# Patient Record
Sex: Female | Born: 1990 | Race: White | Hispanic: No | Marital: Married | Smoking: Never smoker
Health system: Southern US, Community
[De-identification: ages and names within clinical notes are randomized; demographics above are authoritative.]

## PROBLEM LIST (undated history)

## (undated) DIAGNOSIS — G9332 Myalgic encephalomyelitis/chronic fatigue syndrome: Secondary | ICD-10-CM

## (undated) DIAGNOSIS — F329 Major depressive disorder, single episode, unspecified: Secondary | ICD-10-CM

## (undated) DIAGNOSIS — Z862 Personal history of diseases of the blood and blood-forming organs and certain disorders involving the immune mechanism: Secondary | ICD-10-CM

## (undated) DIAGNOSIS — E042 Nontoxic multinodular goiter: Secondary | ICD-10-CM

## (undated) DIAGNOSIS — G43909 Migraine, unspecified, not intractable, without status migrainosus: Secondary | ICD-10-CM

## (undated) DIAGNOSIS — N809 Endometriosis, unspecified: Secondary | ICD-10-CM

## (undated) DIAGNOSIS — F411 Generalized anxiety disorder: Secondary | ICD-10-CM

## (undated) DIAGNOSIS — F4310 Post-traumatic stress disorder, unspecified: Secondary | ICD-10-CM

## (undated) DIAGNOSIS — R5382 Chronic fatigue, unspecified: Secondary | ICD-10-CM

## (undated) DIAGNOSIS — F419 Anxiety disorder, unspecified: Secondary | ICD-10-CM

## (undated) DIAGNOSIS — G8929 Other chronic pain: Secondary | ICD-10-CM

## (undated) DIAGNOSIS — M797 Fibromyalgia: Secondary | ICD-10-CM

## (undated) DIAGNOSIS — R102 Pelvic and perineal pain: Secondary | ICD-10-CM

## (undated) HISTORY — PX: WISDOM TOOTH EXTRACTION: SHX21

## (undated) HISTORY — PX: NO PAST SURGERIES: SHX2092

---

## 2018-02-08 ENCOUNTER — Emergency Department (HOSPITAL_COMMUNITY): Payer: BLUE CROSS/BLUE SHIELD

## 2018-02-08 ENCOUNTER — Other Ambulatory Visit: Payer: Self-pay

## 2018-02-08 ENCOUNTER — Emergency Department (HOSPITAL_COMMUNITY)
Admission: EM | Admit: 2018-02-08 | Discharge: 2018-02-08 | Disposition: A | Discharge status: Home / Self Care | Payer: BLUE CROSS/BLUE SHIELD | Attending: Emergency Medicine | Admitting: Emergency Medicine

## 2018-02-08 ENCOUNTER — Encounter (HOSPITAL_COMMUNITY): Payer: Self-pay | Admitting: Emergency Medicine

## 2018-02-08 DIAGNOSIS — R1031 Right lower quadrant pain: Secondary | ICD-10-CM | POA: Insufficient documentation

## 2018-02-08 HISTORY — DX: Anxiety disorder, unspecified: F41.9

## 2018-02-08 HISTORY — DX: Major depressive disorder, single episode, unspecified: F32.9

## 2018-02-08 HISTORY — DX: Chronic fatigue, unspecified: R53.82

## 2018-02-08 HISTORY — DX: Other chronic pain: G89.29

## 2018-02-08 LAB — URINALYSIS, ROUTINE W REFLEX MICROSCOPIC
Bilirubin Urine: NEGATIVE
Glucose, UA: NEGATIVE mg/dL
KETONES UR: 5 mg/dL — AB
Leukocytes, UA: NEGATIVE
Nitrite: NEGATIVE
PH: 6 (ref 5.0–8.0)
Protein, ur: NEGATIVE mg/dL
SPECIFIC GRAVITY, URINE: 1.012 (ref 1.005–1.030)

## 2018-02-08 LAB — COMPREHENSIVE METABOLIC PANEL
ALT: 10 U/L (ref 0–44)
AST: 14 U/L — AB (ref 15–41)
Albumin: 4.3 g/dL (ref 3.5–5.0)
Alkaline Phosphatase: 46 U/L (ref 38–126)
Anion gap: 9 (ref 5–15)
BILIRUBIN TOTAL: 1.1 mg/dL (ref 0.3–1.2)
CALCIUM: 9.5 mg/dL (ref 8.9–10.3)
CHLORIDE: 104 mmol/L (ref 98–111)
CO2: 26 mmol/L (ref 22–32)
CREATININE: 0.81 mg/dL (ref 0.44–1.00)
Glucose, Bld: 92 mg/dL (ref 70–99)
POTASSIUM: 4 mmol/L (ref 3.5–5.1)
Sodium: 139 mmol/L (ref 135–145)
Total Protein: 7.2 g/dL (ref 6.5–8.1)

## 2018-02-08 LAB — CBC
HCT: 42.2 % (ref 36.0–46.0)
Hemoglobin: 13.9 g/dL (ref 12.0–15.0)
MCH: 29.5 pg (ref 26.0–34.0)
MCHC: 32.9 g/dL (ref 30.0–36.0)
MCV: 89.6 fL (ref 78.0–100.0)
PLATELETS: 264 10*3/uL (ref 150–400)
RBC: 4.71 MIL/uL (ref 3.87–5.11)
RDW: 11.9 % (ref 11.5–15.5)
WBC: 6.2 10*3/uL (ref 4.0–10.5)

## 2018-02-08 LAB — I-STAT BETA HCG BLOOD, ED (MC, WL, AP ONLY): I-stat hCG, quantitative: <5 m[IU]/mL (ref <5)

## 2018-02-08 LAB — LIPASE, BLOOD: LIPASE: 30 U/L (ref 11–51)

## 2018-02-08 MED ORDER — IOPAMIDOL (ISOVUE-300) INJECTION 61%
100.0000 mL | Freq: Once | INTRAVENOUS | Status: AC | PRN
  Administered 2018-02-08: 100 mL via INTRAVENOUS

## 2018-02-08 MED ORDER — IOPAMIDOL (ISOVUE-300) INJECTION 61%
INTRAVENOUS | Status: AC
  Filled 2018-02-08: qty 100

## 2018-02-08 NOTE — ED Provider Notes (Signed)
Patient placed in Quick Look pathway, seen and evaluated   Chief Complaint: abd pain  HPI:   Pt presents to the ED today c/o RLQ abd pain the began 2 days ago. Pain waxes and wanes. Pain worse is she tenses her abd muscles. She reports nausea, but denies diarrhea, constipation, urinary sxs. Denies fevers or chills. Denies vaginal discharge, bleeding. Reports recently has had irregular menses. Denies concern for STD. LMP was last week.  ROS: abd pain (one)  Physical Exam:   Gen: No distress  Neuro: Awake and Alert  Skin: Warm    Focused Exam: +BS.  TTP to RLQ. Rebound tenderness present. No guarding or rigidity.    Initiation of care has begun. The patient has been counseled on the process, plan, and necessity for staying for the completion/evaluation, and the remainder of the medical screening examination  Pt advised to inform nursing staff immediately if they experience any new or worsening of symptoms while waiting in the waiting room.    Rayne Du 02/08/18 1741    Gerhard Munch, MD 02/08/18 [phone]

## 2018-02-08 NOTE — ED Notes (Signed)
Pt back from CT

## 2018-02-08 NOTE — ED Triage Notes (Signed)
Patient to ED c/o RLQ pain x 2 days with intermittent nausea and low-grade fevers. Denies vomiting, diarrhea, urinary symptoms. RLQ tender to palpation. LMP 7 days ago.

## 2018-02-08 NOTE — Discharge Instructions (Addendum)
Please read attached information. If you experience any new or worsening signs or symptoms please return to the emergency room for evaluation. Please follow-up with your primary care provider or specialist as discussed.  °

## 2018-02-08 NOTE — ED Provider Notes (Signed)
[facility] EMERGENCY DEPARTMENT Provider Note   CSN: [id number] Arrival date & time: 02/08/18  1605     History   Chief Complaint Chief Complaint  Patient presents with  . Abdominal Pain    HPI Alicia Olsen is a 27 y.o. female.  HPI    26 year old female presents today with complaints of right lower quadrant abdominal pain. Patient notes symptoms started 2 days ago with a waxing and waning ache in her right lower abdomen. She notes this was slow onset with progressive worsening. She reports some nausea but denies any vomiting, diarrhea, constipation or urinary symptoms. Patient and she did not have a bowel movement today. She denies any fever, vaginal discharge, or bleeding.    Past Medical History:  Diagnosis Date  . Anxiety   . Chronic fatigue   . Chronic pain   . Depression     There are no active problems to display for this patient.   Past Surgical History:  Procedure Laterality Date  . WISDOM TOOTH EXTRACTION       OB History   None      Home Medications    Prior to Admission medications   Not on File    Family History No family history on file.  Social History Social History   Tobacco Use  . Smoking status: Not on file  Substance Use Topics  . Alcohol use: Not on file  . Drug use: Not on file     Allergies   Patient has no known allergies.   Review of Systems Review of Systems  All other systems reviewed and are negative.    Physical Exam Updated Vital Signs BP 116/86 (BP Location: Right Arm)   Pulse (!) 58   Temp 99.3 F (37.4 C) (Oral)   Resp 18   LMP 02/01/2018 (Exact Date)   SpO2 100%   Physical Exam  Constitutional: She is oriented to person, place, and time. She appears well-developed and well-nourished.  HENT:  Head: Normocephalic and atraumatic.  Eyes: Pupils are equal, round, and reactive to light. Conjunctivae are normal. Right eye exhibits no discharge. Left eye exhibits no discharge. No  scleral icterus.  Neck: Normal range of motion. No JVD present. No tracheal deviation present.  Pulmonary/Chest: Effort normal. No stridor.  Abdominal: Soft. She exhibits no distension and no mass. There is tenderness. There is no rebound and no guarding. No hernia.  Tenderness to palpation of right lower quadrant no rebound or guarding, remainder of abdomen soft nontender  Neurological: She is alert and oriented to person, place, and time. Coordination normal.  Psychiatric: She has a normal mood and affect. Her behavior is normal. Judgment and thought content normal.  Nursing note and vitals reviewed.    ED Treatments / Results  Labs (all labs ordered are listed, but only abnormal results are displayed) Labs Reviewed  COMPREHENSIVE METABOLIC PANEL - Abnormal; Notable for the following components:      Result Value   BUN <5 (*)    AST 14 (*)    All other components within normal limits  URINALYSIS, ROUTINE W REFLEX MICROSCOPIC - Abnormal; Notable for the following components:   Hgb urine dipstick SMALL (*)    Ketones, ur 5 (*)    Bacteria, UA FEW (*)    All other components within normal limits  LIPASE, BLOOD  CBC  I-STAT BETA HCG BLOOD, ED ([facility], [facility], [facility] ONLY)    EKG None  Radiology Ct Abdomen Pelvis W Contrast  Result Date: 02/08/2018 CLINICAL DATA:  Right lower quadrant pain with nausea EXAM: CT ABDOMEN AND PELVIS WITH CONTRAST TECHNIQUE: Multidetector CT imaging of the abdomen and pelvis was performed using the standard protocol following bolus administration of intravenous contrast. CONTRAST:  ISOVUE-300 IOPAMIDOL (ISOVUE-300) INJECTION 61% COMPARISON:  None. FINDINGS: Lower chest: No acute abnormality. Hepatobiliary: No focal liver abnormality is seen. No gallstones, gallbladder wall thickening, or biliary dilatation. Pancreas: Unremarkable. No pancreatic ductal dilatation or surrounding inflammatory changes. Spleen: Normal in size without focal abnormality.  Adrenals/Urinary Tract: Adrenal glands are unremarkable. Kidneys are normal, without renal calculi, focal lesion, or hydronephrosis. Bladder is unremarkable. Stomach/Bowel: Stomach is within normal limits. Appendix appears normal. No evidence of bowel wall thickening, distention, or inflammatory changes. Vascular/Lymphatic: No significant vascular findings are present. No enlarged abdominal or pelvic lymph nodes. Reproductive: Uterus and bilateral adnexa are unremarkable. Small right ovarian cyst Other: Negative for free air or free fluid. Musculoskeletal: No acute or significant osseous findings. IMPRESSION: Negative for acute appendicitis. No CT evidence for acute intra-abdominal or pelvic abnormality Electronically Signed   By: Jasmine Pang M.D.   On: 02/08/2018 21:33    Procedures Procedures (including critical care time)  Medications Ordered in ED Medications  iopamidol (ISOVUE-300) 61 % injection (has no administration in time range)  iopamidol (ISOVUE-300) 61 % injection 100 mL (100 mLs Intravenous Contrast Given 02/08/18 2053)     Initial Impression / Assessment and Plan / ED Course  I have reviewed the triage vital signs and the nursing notes.  Pertinent labs & imaging results that were available during my care of the patient were reviewed by me and considered in my medical decision making (see chart for details).     Labs: urinalysis, CBC, CMP, lipase, I stat beta HCG  Imaging: CT abdomen pelvis with contrast  Consults:  Therapeutics:  Discharge Meds:   Assessment/Plan:  27 YO female presents today with complaints of abdominal pain. Patient has reassuring workup with no signs of infectious etiology, no acute findings on CT scan. She has no vaginal complaints, low suspicion for GYN pathology including torsion. Patient will instructed use ibuprofen or Tylenol as needed for discomfort, return immediately if she doubts any new or worsening signs or symptoms and follow up as an  outpatient with her primary care if symptoms persist. Patient verbalized understanding and agreement to today's plan had no further questions or concerns.    Final Clinical Impressions(s) / ED Diagnoses   Final diagnoses:  Right lower quadrant abdominal pain    ED Discharge Orders    None       Rosalio Loud 02/08/18 2144    Arby Barrette, MD 02/24/18 1035

## 2018-02-08 NOTE — ED Notes (Signed)
Patient able to ambulate independently  

## 2018-07-08 ENCOUNTER — Other Ambulatory Visit: Payer: Self-pay | Admitting: Family Medicine

## 2018-07-08 DIAGNOSIS — E041 Nontoxic single thyroid nodule: Secondary | ICD-10-CM

## 2018-07-13 ENCOUNTER — Other Ambulatory Visit: Payer: BLUE CROSS/BLUE SHIELD

## 2018-07-14 ENCOUNTER — Other Ambulatory Visit: Payer: BLUE CROSS/BLUE SHIELD

## 2018-07-21 ENCOUNTER — Ambulatory Visit
Admission: RE | Admit: 2018-07-21 | Discharge: 2018-07-21 | Disposition: A | Discharge status: Home / Self Care | Payer: BLUE CROSS/BLUE SHIELD | Source: Ambulatory Visit | Attending: Family Medicine | Admitting: Family Medicine

## 2018-07-21 DIAGNOSIS — E041 Nontoxic single thyroid nodule: Secondary | ICD-10-CM

## 2019-01-12 ENCOUNTER — Other Ambulatory Visit: Payer: Self-pay | Admitting: Nurse Practitioner

## 2019-01-12 DIAGNOSIS — R102 Pelvic and perineal pain: Secondary | ICD-10-CM

## 2019-01-12 DIAGNOSIS — R109 Unspecified abdominal pain: Secondary | ICD-10-CM

## 2019-01-24 ENCOUNTER — Ambulatory Visit
Admission: RE | Admit: 2019-01-24 | Discharge: 2019-01-24 | Disposition: A | Discharge status: Home / Self Care | Payer: BC Managed Care – PPO | Source: Ambulatory Visit | Attending: Nurse Practitioner | Admitting: Nurse Practitioner

## 2019-01-24 ENCOUNTER — Ambulatory Visit
Admission: RE | Admit: 2019-01-24 | Discharge: 2019-01-24 | Disposition: A | Discharge status: Home / Self Care | Payer: Self-pay | Source: Ambulatory Visit | Attending: Nurse Practitioner | Admitting: Nurse Practitioner

## 2019-01-24 DIAGNOSIS — R109 Unspecified abdominal pain: Secondary | ICD-10-CM

## 2019-01-24 DIAGNOSIS — R102 Pelvic and perineal pain: Secondary | ICD-10-CM

## 2019-01-30 ENCOUNTER — Other Ambulatory Visit: Payer: Self-pay | Admitting: Physician Assistant

## 2019-01-30 DIAGNOSIS — R109 Unspecified abdominal pain: Secondary | ICD-10-CM

## 2019-02-01 ENCOUNTER — Ambulatory Visit
Admission: RE | Admit: 2019-02-01 | Discharge: 2019-02-01 | Disposition: A | Discharge status: Home / Self Care | Payer: BC Managed Care – PPO | Source: Ambulatory Visit | Attending: Physician Assistant | Admitting: Physician Assistant

## 2019-02-01 ENCOUNTER — Other Ambulatory Visit: Payer: Self-pay

## 2019-02-01 DIAGNOSIS — R109 Unspecified abdominal pain: Secondary | ICD-10-CM

## 2019-04-11 ENCOUNTER — Other Ambulatory Visit: Payer: Self-pay

## 2019-04-11 DIAGNOSIS — Z20822 Contact with and (suspected) exposure to covid-19: Secondary | ICD-10-CM

## 2019-04-12 LAB — NOVEL CORONAVIRUS, NAA: SARS-CoV-2, NAA: NOT DETECTED

## 2019-07-20 ENCOUNTER — Other Ambulatory Visit: Payer: Self-pay

## 2019-07-20 ENCOUNTER — Encounter (HOSPITAL_BASED_OUTPATIENT_CLINIC_OR_DEPARTMENT_OTHER): Payer: Self-pay | Admitting: Obstetrics & Gynecology

## 2019-07-20 NOTE — Progress Notes (Signed)
Spoke w/ via phone for pre-op interview--- PT Lab needs dos----   Urine preg, T&S            Lab results------ no COVID test ------ 07-25-2019 @ 1405 Arrive at ------- 0530 NPO after ------ MN Medications to take morning of surgery ----- NONE Diabetic medication ----- n/a Patient Special Instructions ----- n/a Pre-Op special Istructions ----- n/a Patient verbalized understanding of instructions that were given at this phone interview. Patient denies shortness of breath, chest pain, fever, cough a this phone interview.

## 2019-07-22 ENCOUNTER — Other Ambulatory Visit: Payer: Self-pay

## 2019-07-22 ENCOUNTER — Emergency Department (HOSPITAL_COMMUNITY): Payer: BC Managed Care – PPO

## 2019-07-22 ENCOUNTER — Emergency Department (HOSPITAL_COMMUNITY)
Admission: EM | Admit: 2019-07-22 | Discharge: 2019-07-23 | Disposition: A | Discharge status: Home / Self Care | Payer: BC Managed Care – PPO | Attending: Emergency Medicine | Admitting: Emergency Medicine

## 2019-07-22 ENCOUNTER — Encounter (HOSPITAL_COMMUNITY): Payer: Self-pay | Admitting: *Deleted

## 2019-07-22 DIAGNOSIS — Z79899 Other long term (current) drug therapy: Secondary | ICD-10-CM | POA: Insufficient documentation

## 2019-07-22 DIAGNOSIS — R102 Pelvic and perineal pain: Secondary | ICD-10-CM | POA: Diagnosis present

## 2019-07-22 DIAGNOSIS — B9689 Other specified bacterial agents as the cause of diseases classified elsewhere: Secondary | ICD-10-CM | POA: Diagnosis not present

## 2019-07-22 DIAGNOSIS — N760 Acute vaginitis: Secondary | ICD-10-CM | POA: Insufficient documentation

## 2019-07-22 DIAGNOSIS — R5383 Other fatigue: Secondary | ICD-10-CM

## 2019-07-22 LAB — COMPREHENSIVE METABOLIC PANEL
ALT: 13 U/L (ref 0–44)
AST: 15 U/L (ref 15–41)
Albumin: 4.3 g/dL (ref 3.5–5.0)
Alkaline Phosphatase: 53 U/L (ref 38–126)
Anion gap: 12 (ref 5–15)
BUN: 8 mg/dL (ref 6–20)
CO2: 24 mmol/L (ref 22–32)
Calcium: 9.3 mg/dL (ref 8.9–10.3)
Chloride: 105 mmol/L (ref 98–111)
Creatinine, Ser: 0.73 mg/dL (ref 0.44–1.00)
GFR calc Af Amer: >60 mL/min (ref >60)
GFR calc non Af Amer: >60 mL/min (ref >60)
Glucose, Bld: 103 mg/dL — ABNORMAL HIGH (ref 70–99)
Potassium: 3.5 mmol/L (ref 3.5–5.1)
Sodium: 141 mmol/L (ref 135–145)
Total Bilirubin: 0.4 mg/dL (ref 0.3–1.2)
Total Protein: 7.6 g/dL (ref 6.5–8.1)

## 2019-07-22 LAB — I-STAT BETA HCG BLOOD, ED (MC, WL, AP ONLY): I-stat hCG, quantitative: <5 m[IU]/mL (ref <5)

## 2019-07-22 LAB — URINALYSIS, ROUTINE W REFLEX MICROSCOPIC
Bilirubin Urine: NEGATIVE
Glucose, UA: NEGATIVE mg/dL
Hgb urine dipstick: NEGATIVE
Ketones, ur: NEGATIVE mg/dL
Leukocytes,Ua: NEGATIVE
Nitrite: NEGATIVE
Protein, ur: NEGATIVE mg/dL
Specific Gravity, Urine: 1.012 (ref 1.005–1.030)
pH: 8 (ref 5.0–8.0)

## 2019-07-22 LAB — CBC
HCT: 41.7 % (ref 36.0–46.0)
Hemoglobin: 14.1 g/dL (ref 12.0–15.0)
MCH: 30 pg (ref 26.0–34.0)
MCHC: 33.8 g/dL (ref 30.0–36.0)
MCV: 88.7 fL (ref 80.0–100.0)
Platelets: 329 10*3/uL (ref 150–400)
RBC: 4.7 MIL/uL (ref 3.87–5.11)
RDW: 12.1 % (ref 11.5–15.5)
WBC: 10 10*3/uL (ref 4.0–10.5)
nRBC: 0 % (ref 0.0–0.2)

## 2019-07-22 LAB — LIPASE, BLOOD: Lipase: 32 U/L (ref 11–51)

## 2019-07-22 LAB — WET PREP, GENITAL
Sperm: NONE SEEN
Trich, Wet Prep: NONE SEEN
Yeast Wet Prep HPF POC: NONE SEEN

## 2019-07-22 LAB — PREGNANCY, URINE: Preg Test, Ur: NEGATIVE

## 2019-07-22 LAB — CK: Total CK: 92 U/L (ref 38–234)

## 2019-07-22 MED ORDER — IOHEXOL 300 MG/ML  SOLN
100.0000 mL | Freq: Once | INTRAMUSCULAR | Status: AC | PRN
  Administered 2019-07-22: 100 mL via INTRAVENOUS

## 2019-07-22 MED ORDER — METRONIDAZOLE 500 MG PO TABS: 500.0000 mg | ORAL_TABLET | Freq: Two times a day (BID) | ORAL | 0 refills | Status: AC

## 2019-07-22 MED ORDER — SODIUM CHLORIDE 0.9% FLUSH: 3.0000 mL | Freq: Once | INTRAVENOUS | Status: DC

## 2019-07-22 MED ORDER — SODIUM CHLORIDE 0.9 % IV BOLUS
1000.0000 mL | Freq: Once | INTRAVENOUS | Status: AC
  Administered 2019-07-22: 22:00:00 1000 mL via INTRAVENOUS

## 2019-07-22 NOTE — Discharge Instructions (Addendum)
Your testing is reassuring.  Your ultrasound of your ovaries appears to be normal.  And your appendix appears normal on CT scan.  You should follow-up with your gynecologist for your expiratory surgery next week.  Take the medication for bacterial vaginosis as prescribed.  Do not drink alcohol when taking this medication.  Return to the ED if you have worsening pain, fever, vomiting, or other concerns

## 2019-07-22 NOTE — ED Provider Notes (Signed)
[facility] EMERGENCY DEPARTMENT Provider Note   CSN: [id number] Arrival date & time: 07/22/19  2008     History Chief Complaint  Patient presents with  . Abdominal Pain    Alicia Olsen is a 29 y.o. female.  Patient with history of chronic fatigue syndrome, fibromyalgia and possible endometriosis here with fatigue all over and concerned that she was dehydrated.  States she has extreme fatigue all over but happened after she was grooming her dog today.  It is not uncommon for her but she believes it is worse than usual and she is concerned that she has rhabdomyolysis.  She also has lower abdominal pain which onset about the same time across her lower abdomen that is similar to previous episodes.  She is being scheduled for a laparoscopy by her gynecologist next week to evaluate for possible endometriosis.  She reports this pain is similar to what she is experienced in the past.  She has spotting vaginal bleeding due to her IUD.  She denies any pain with urination or blood in the urine.  No vomiting or diarrhea or fever.  No previous abdominal surgeries. States she is not as concerned about the pain as she is about her fatigue all over.  The history is provided by the patient.  Abdominal Pain Associated symptoms: fatigue   Associated symptoms: no chest pain, no cough, no dysuria, no fever, no hematuria, no nausea, no shortness of breath and no vomiting        Past Medical History:  Diagnosis Date  . Chronic fatigue syndrome    followed by dr Reola Calkins   . Chronic pain   . Endometriosis   . Fibromyalgia    followed by- dr Reola Calkins  . GAD (generalized anxiety disorder)   . History of anemia   . MDD (major depressive disorder)   . Migraines   . Multinodular thyroid    last thyroid ultrasound done by dr Reola Calkins in epic 07-21-2018, stable and did not meet critiria for bx  . Pelvic pain   . PTSD (post-traumatic stress disorder)     There are no problems to display for this  patient.   Past Surgical History:  Procedure Laterality Date  . NO PAST SURGERIES    . WISDOM TOOTH EXTRACTION       OB History   No obstetric history on file.     No family history on file.  Social History   Tobacco Use  . Smoking status: Never Smoker  . Smokeless tobacco: Never Used  Substance Use Topics  . Alcohol use: Yes    Comment: one dialy in evening - wine or a drink  . Drug use: Never    Home Medications Prior to Admission medications   Medication Sig Start Date End Date Taking? Authorizing Provider  busPIRone (BUSPAR) 15 MG tablet Take 15 mg by mouth 3 (three) times daily.    [provider]  cyclobenzaprine (FEXMID) 7.5 MG tablet Take 7.5 mg by mouth 3 (three) times daily as needed for muscle spasms.    [provider]  escitalopram (LEXAPRO) 10 MG tablet Take 10 mg by mouth at bedtime.     [provider]  levonorgestrel (MIRENA) 20 MCG/24HR IUD 1 each by Intrauterine route once.    [provider]  Multiple Vitamins-Minerals (CENTRUM ADULTS) TABS Take 1 tablet by mouth daily.    [provider]  pregabalin (LYRICA) 50 MG capsule Take 100 mg by mouth at bedtime.  [provider]    Allergies    Patient has no known allergies.  Review of Systems   Review of Systems  Constitutional: Positive for activity change, appetite change and fatigue. Negative for fever.  HENT: Negative for congestion and rhinorrhea.   Respiratory: Negative for cough, chest tightness and shortness of breath.   Cardiovascular: Negative for chest pain.  Gastrointestinal: Positive for abdominal pain. Negative for nausea and vomiting.  Genitourinary: Negative for dysuria and hematuria.  Musculoskeletal: Positive for arthralgias and myalgias.  Skin: Negative for rash.  Neurological: Positive for weakness. Negative for dizziness and headaches.    all other systems are negative except as noted in the HPI and PMH.   Physical  Exam Updated Vital Signs BP (!) 125/92 (BP Location: Right Arm)   Pulse 96   Temp 98.3 F (36.8 C) (Oral)   Resp 14   Ht 5\' 10"  (1.778 m)   Wt 77.1 kg   SpO2 100%   BMI 24.39 kg/m   Physical Exam Vitals and nursing note reviewed.  Constitutional:      General: She is not in acute distress.    Appearance: She is well-developed.  HENT:     Head: Normocephalic and atraumatic.     Mouth/Throat:     Pharynx: No oropharyngeal exudate.  Eyes:     Conjunctiva/sclera: Conjunctivae normal.     Pupils: Pupils are equal, round, and reactive to light.  Neck:     Comments: No meningismus. Cardiovascular:     Rate and Rhythm: Normal rate and regular rhythm.     Heart sounds: Normal heart sounds. No murmur.  Pulmonary:     Effort: Pulmonary effort is normal. No respiratory distress.     Breath sounds: Normal breath sounds.  Abdominal:     Palpations: Abdomen is soft.     Tenderness: There is abdominal tenderness. There is no guarding or rebound.     Comments: Suprapubic and right lower quadrant pain, no guarding or rebound  Genitourinary:    Comments: Chaperone present.  Normal sinus genitalia.  White discharge in vaginal vault.  IUD string present.  No CMT.  No levels adnexal pain but diffusely tender Musculoskeletal:        General: No tenderness. Normal range of motion.     Cervical back: Normal range of motion and neck supple.     Comments: No CVA tenderness  Skin:    General: Skin is warm.  Neurological:     Mental Status: She is alert and oriented to person, place, and time.     Cranial Nerves: No cranial nerve deficit.     Motor: No abnormal muscle tone.     Coordination: Coordination normal.     Comments: No ataxia on finger to nose bilaterally. No pronator drift. Generalized weakness with poor effor and 4/5 strength throughout. CN 2-12 intact.Equal grip strength. Sensation intact.   Psychiatric:        Behavior: Behavior normal.     ED Results / Procedures /  Treatments   Labs (all labs ordered are listed, but only abnormal results are displayed) Labs Reviewed  WET PREP, GENITAL - Abnormal; Notable for the following components:      Result Value   Clue Cells Wet Prep HPF POC PRESENT (*)    WBC, Wet Prep HPF POC MANY (*)    All other components within normal limits  COMPREHENSIVE METABOLIC PANEL - Abnormal; Notable for the following components:   Glucose, Bld 103 (*)  All other components within normal limits  URINALYSIS, ROUTINE W REFLEX MICROSCOPIC - Abnormal; Notable for the following components:   APPearance HAZY (*)    All other components within normal limits  LIPASE, BLOOD  CBC  CK  PREGNANCY, URINE  I-STAT BETA HCG BLOOD, ED ([facility], [facility], [facility] ONLY)  GC/CHLAMYDIA PROBE AMP ([place]) NOT AT [facility]    EKG None  Radiology US Transvaginal Non-OB  Result Date: 07/22/2019 CLINICAL DATA:  Pelvic pain EXAM: TRANSABDOMINAL AND TRANSVAGINAL ULTRASOUND OF PELVIS DOPPLER ULTRASOUND OF OVARIES TECHNIQUE: Both transabdominal and transvaginal ultrasound examinations of the pelvis were performed. Transabdominal technique was performed for global imaging of the pelvis including uterus, ovaries, adnexal regions, and pelvic cul-de-sac. It was necessary to proceed with endovaginal exam following the transabdominal exam to visualize the uterus endometrium ovaries. Color and duplex Doppler ultrasound was utilized to evaluate blood flow to the ovaries. COMPARISON:  CT 07/22/2019 FINDINGS: Uterus Measurements: 6.5 x 2.9 x 4.1 cm = volume: 38.1 mL. No fibroids or other mass visualized. Endometrium Thickness: 5 mm.  IUD within the uterus. Right ovary Measurements: 3.5 x 2 x 1.9 cm = volume: 6.9 mL. Normal appearance/no adnexal mass. Left ovary Measurements: 3.1 x 1.8 x 2.2 cm = volume: 6.1 mL. Normal appearance/no adnexal mass. Pulsed Doppler evaluation of both ovaries demonstrates normal low-resistance arterial and venous waveforms. Other findings No abnormal  free fluid. IMPRESSION: 1. IUD appears appropriately positioned by sonography. 2. Negative for ovarian torsion or ovarian mass lesion. Electronically Signed   By: Donavan Foil M.D.   On: 07/22/2019 23:54   US Pelvis Complete  Result Date: 07/22/2019 CLINICAL DATA:  Pelvic pain EXAM: TRANSABDOMINAL AND TRANSVAGINAL ULTRASOUND OF PELVIS DOPPLER ULTRASOUND OF OVARIES TECHNIQUE: Both transabdominal and transvaginal ultrasound examinations of the pelvis were performed. Transabdominal technique was performed for global imaging of the pelvis including uterus, ovaries, adnexal regions, and pelvic cul-de-sac. It was necessary to proceed with endovaginal exam following the transabdominal exam to visualize the uterus endometrium ovaries. Color and duplex Doppler ultrasound was utilized to evaluate blood flow to the ovaries. COMPARISON:  CT 07/22/2019 FINDINGS: Uterus Measurements: 6.5 x 2.9 x 4.1 cm = volume: 38.1 mL. No fibroids or other mass visualized. Endometrium Thickness: 5 mm.  IUD within the uterus. Right ovary Measurements: 3.5 x 2 x 1.9 cm = volume: 6.9 mL. Normal appearance/no adnexal mass. Left ovary Measurements: 3.1 x 1.8 x 2.2 cm = volume: 6.1 mL. Normal appearance/no adnexal mass. Pulsed Doppler evaluation of both ovaries demonstrates normal low-resistance arterial and venous waveforms. Other findings No abnormal free fluid. IMPRESSION: 1. IUD appears appropriately positioned by sonography. 2. Negative for ovarian torsion or ovarian mass lesion. Electronically Signed   By: Donavan Foil M.D.   On: 07/22/2019 23:54   CT ABDOMEN PELVIS W CONTRAST  Result Date: 07/22/2019 CLINICAL DATA:  Right lower quadrant abdominal pain EXAM: CT ABDOMEN AND PELVIS WITH CONTRAST TECHNIQUE: Multidetector CT imaging of the abdomen and pelvis was performed using the standard protocol following bolus administration of intravenous contrast. CONTRAST:  132mL OMNIPAQUE IOHEXOL 300 MG/ML  SOLN COMPARISON:  02/01/2019 FINDINGS:  Lower chest: No acute pleural or parenchymal lung disease. Hepatobiliary: No focal liver abnormality is seen. No gallstones, gallbladder wall thickening, or biliary dilatation. Pancreas: Unremarkable. No pancreatic ductal dilatation or surrounding inflammatory changes. Spleen: Normal in size without focal abnormality. Adrenals/Urinary Tract: Adrenal glands are unremarkable. Kidneys are normal, without renal calculi, focal lesion, or hydronephrosis. Bladder is unremarkable. Stomach/Bowel: No bowel obstruction or  ileus. Normal gas-filled appendix right lower quadrant. No inflammatory changes. Vascular/Lymphatic: No significant vascular findings are present. No enlarged abdominal or pelvic lymph nodes. Reproductive: Uterus and bilateral adnexa are unremarkable. IUD is identified. Other: No abdominal wall hernia or abnormality. No abdominopelvic ascites. Musculoskeletal: No acute or destructive bony lesions. Reconstructed images demonstrate no additional findings. IMPRESSION: 1. No acute intra-abdominal or intrapelvic process. Normal appendix. Electronically Signed   By: Sharlet Salina M.D.   On: 07/22/2019 23:30   [place] Art/Ven Flow Abd Pelv Doppler  Result Date: 07/22/2019 CLINICAL DATA:  Pelvic pain EXAM: TRANSABDOMINAL AND TRANSVAGINAL ULTRASOUND OF PELVIS DOPPLER ULTRASOUND OF OVARIES TECHNIQUE: Both transabdominal and transvaginal ultrasound examinations of the pelvis were performed. Transabdominal technique was performed for global imaging of the pelvis including uterus, ovaries, adnexal regions, and pelvic cul-de-sac. It was necessary to proceed with endovaginal exam following the transabdominal exam to visualize the uterus endometrium ovaries. Color and duplex Doppler ultrasound was utilized to evaluate blood flow to the ovaries. COMPARISON:  CT 07/22/2019 FINDINGS: Uterus Measurements: 6.5 x 2.9 x 4.1 cm = volume: 38.1 mL. No fibroids or other mass visualized. Endometrium Thickness: 5 mm.  IUD within the  uterus. Right ovary Measurements: 3.5 x 2 x 1.9 cm = volume: 6.9 mL. Normal appearance/no adnexal mass. Left ovary Measurements: 3.1 x 1.8 x 2.2 cm = volume: 6.1 mL. Normal appearance/no adnexal mass. Pulsed Doppler evaluation of both ovaries demonstrates normal low-resistance arterial and venous waveforms. Other findings No abnormal free fluid. IMPRESSION: 1. IUD appears appropriately positioned by sonography. 2. Negative for ovarian torsion or ovarian mass lesion. Electronically Signed   By: Jasmine Pang M.D.   On: 07/22/2019 23:54    Procedures Procedures (including critical care time)  Medications Ordered in ED Medications  sodium chloride flush (NS) 0.9 % injection 3 mL (has no administration in time range)  sodium chloride 0.9 % bolus 1,000 mL (has no administration in time range)    ED Course  I have reviewed the triage vital signs and the nursing notes.  Pertinent labs & imaging results that were available during my care of the patient were reviewed by me and considered in my medical decision making (see chart for details).    MDM Rules/Calculators/A&P                     Acute on chronic fatigue with lower abdominal pain similar to previous.  Vitals are stable.  And is soft without peritoneal signs.  Urinalysis is negative. HCG negative.   Exam benign as above.  Ultrasound shows no evidence of ovarian torsion and IUD is positioned appropriately.  Urinalysis is negative, hCG is negative.  CT scan shows no other explanation for her abdominal pain.  Labs appear to be at baseline with stable hemoglobin.  Normal chemistry and normal CK.  No evidence of rhabdomyolysis which is patient's main concern.  Advised to follow-up with her gynecologist as scheduled for her surgery next week.  Take the Flagyl for suspected bacterial vaginosis.  Return to the ED with worsening symptoms. Final Clinical Impression(s) / ED Diagnoses Final diagnoses:  Pelvic pain  Fatigue, unspecified type   Bacterial vaginosis    Rx / DC Orders ED Discharge Orders    None       Jalene Lacko, Jeannett Senior, MD 07/23/19 [phone]

## 2019-07-22 NOTE — ED Triage Notes (Signed)
The pt is c/o abd pain and chronic fatigue and she feels like its too much effort to do thinhs  She reports that she thinks she has rabdo lmp  iud

## 2019-07-25 ENCOUNTER — Other Ambulatory Visit (HOSPITAL_COMMUNITY)
Admission: RE | Admit: 2019-07-25 | Discharge: 2019-07-25 | Disposition: A | Discharge status: Home / Self Care | Payer: BC Managed Care – PPO | Source: Ambulatory Visit | Attending: Obstetrics & Gynecology | Admitting: Obstetrics & Gynecology

## 2019-07-25 DIAGNOSIS — Z01812 Encounter for preprocedural laboratory examination: Secondary | ICD-10-CM | POA: Insufficient documentation

## 2019-07-25 DIAGNOSIS — Z20822 Contact with and (suspected) exposure to covid-19: Secondary | ICD-10-CM | POA: Diagnosis not present

## 2019-07-25 LAB — GC/CHLAMYDIA PROBE AMP (~~LOC~~) NOT AT ARMC
Chlamydia: NEGATIVE
Neisseria Gonorrhea: NEGATIVE

## 2019-07-25 LAB — SARS CORONAVIRUS 2 (TAT 6-24 HRS): SARS Coronavirus 2: NEGATIVE

## 2019-07-28 ENCOUNTER — Ambulatory Visit (HOSPITAL_BASED_OUTPATIENT_CLINIC_OR_DEPARTMENT_OTHER)
Admission: RE | Admit: 2019-07-28 | Discharge: 2019-07-28 | Disposition: A | Discharge status: Home / Self Care | Payer: BC Managed Care – PPO | Attending: Obstetrics & Gynecology | Admitting: Obstetrics & Gynecology

## 2019-07-28 ENCOUNTER — Ambulatory Visit (HOSPITAL_BASED_OUTPATIENT_CLINIC_OR_DEPARTMENT_OTHER): Payer: BC Managed Care – PPO | Admitting: Anesthesiology

## 2019-07-28 ENCOUNTER — Encounter (HOSPITAL_BASED_OUTPATIENT_CLINIC_OR_DEPARTMENT_OTHER): Payer: Self-pay | Admitting: Obstetrics & Gynecology

## 2019-07-28 ENCOUNTER — Encounter (HOSPITAL_BASED_OUTPATIENT_CLINIC_OR_DEPARTMENT_OTHER)
Admission: RE | Disposition: A | Discharge status: Home / Self Care | Payer: Self-pay | Source: Home / Self Care | Attending: Obstetrics & Gynecology

## 2019-07-28 ENCOUNTER — Other Ambulatory Visit: Payer: Self-pay

## 2019-07-28 DIAGNOSIS — M797 Fibromyalgia: Secondary | ICD-10-CM | POA: Diagnosis not present

## 2019-07-28 DIAGNOSIS — F329 Major depressive disorder, single episode, unspecified: Secondary | ICD-10-CM | POA: Diagnosis not present

## 2019-07-28 DIAGNOSIS — R5382 Chronic fatigue, unspecified: Secondary | ICD-10-CM | POA: Insufficient documentation

## 2019-07-28 DIAGNOSIS — Z793 Long term (current) use of hormonal contraceptives: Secondary | ICD-10-CM | POA: Diagnosis not present

## 2019-07-28 DIAGNOSIS — Z79899 Other long term (current) drug therapy: Secondary | ICD-10-CM | POA: Insufficient documentation

## 2019-07-28 DIAGNOSIS — G8929 Other chronic pain: Secondary | ICD-10-CM | POA: Diagnosis not present

## 2019-07-28 DIAGNOSIS — F419 Anxiety disorder, unspecified: Secondary | ICD-10-CM | POA: Insufficient documentation

## 2019-07-28 DIAGNOSIS — Z975 Presence of (intrauterine) contraceptive device: Secondary | ICD-10-CM | POA: Diagnosis not present

## 2019-07-28 DIAGNOSIS — N809 Endometriosis, unspecified: Secondary | ICD-10-CM | POA: Diagnosis not present

## 2019-07-28 DIAGNOSIS — R102 Pelvic and perineal pain: Secondary | ICD-10-CM | POA: Diagnosis present

## 2019-07-28 HISTORY — DX: Personal history of diseases of the blood and blood-forming organs and certain disorders involving the immune mechanism: Z86.2

## 2019-07-28 HISTORY — DX: Migraine, unspecified, not intractable, without status migrainosus: G43.909

## 2019-07-28 HISTORY — PX: LAPAROSCOPY: SHX197

## 2019-07-28 HISTORY — DX: Post-traumatic stress disorder, unspecified: F43.10

## 2019-07-28 HISTORY — DX: Endometriosis, unspecified: N80.9

## 2019-07-28 HISTORY — DX: Fibromyalgia: M79.7

## 2019-07-28 HISTORY — DX: Generalized anxiety disorder: F41.1

## 2019-07-28 HISTORY — DX: Nontoxic multinodular goiter: E04.2

## 2019-07-28 HISTORY — DX: Major depressive disorder, single episode, unspecified: F32.9

## 2019-07-28 HISTORY — DX: Myalgic encephalomyelitis/chronic fatigue syndrome: G93.32

## 2019-07-28 HISTORY — DX: Pelvic and perineal pain: R10.2

## 2019-07-28 HISTORY — DX: Chronic fatigue, unspecified: R53.82

## 2019-07-28 LAB — POCT PREGNANCY, URINE: Preg Test, Ur: NEGATIVE

## 2019-07-28 LAB — TYPE AND SCREEN
ABO/RH(D): A POS
Antibody Screen: NEGATIVE

## 2019-07-28 LAB — ABO/RH: ABO/RH(D): A POS

## 2019-07-28 SURGERY — LAPAROSCOPY OPERATIVE
Anesthesia: General | Site: Abdomen

## 2019-07-28 MED ORDER — FENTANYL CITRATE (PF) 100 MCG/2ML IJ SOLN
INTRAMUSCULAR | Status: AC
  Filled 2019-07-28: qty 2

## 2019-07-28 MED ORDER — DEXMEDETOMIDINE HCL 200 MCG/2ML IV SOLN
INTRAVENOUS | Status: DC | PRN
  Administered 2019-07-28 (×4): 4 ug via INTRAVENOUS

## 2019-07-28 MED ORDER — MIDAZOLAM HCL 2 MG/2ML IJ SOLN
INTRAMUSCULAR | Status: AC
  Filled 2019-07-28: qty 2

## 2019-07-28 MED ORDER — ACETAMINOPHEN 500 MG PO TABS
ORAL_TABLET | ORAL | Status: AC
  Filled 2019-07-28: qty 2

## 2019-07-28 MED ORDER — WHITE PETROLATUM EX OINT
TOPICAL_OINTMENT | CUTANEOUS | Status: AC
  Filled 2019-07-28: qty 5

## 2019-07-28 MED ORDER — LIDOCAINE 2% (20 MG/ML) 5 ML SYRINGE
INTRAMUSCULAR | Status: DC | PRN
  Administered 2019-07-28: 60 mg via INTRAVENOUS

## 2019-07-28 MED ORDER — SODIUM CHLORIDE 0.9 % IR SOLN
Status: DC | PRN
  Administered 2019-07-28: 3000 mL

## 2019-07-28 MED ORDER — GABAPENTIN 300 MG PO CAPS
ORAL_CAPSULE | ORAL | Status: AC
  Filled 2019-07-28: qty 1

## 2019-07-28 MED ORDER — PROPOFOL 10 MG/ML IV BOLUS
INTRAVENOUS | Status: AC
  Filled 2019-07-28: qty 40

## 2019-07-28 MED ORDER — DEXMEDETOMIDINE HCL IN NACL 200 MCG/50ML IV SOLN
INTRAVENOUS | Status: AC
  Filled 2019-07-28: qty 50

## 2019-07-28 MED ORDER — ACETAMINOPHEN 500 MG PO TABS
1000.0000 mg | ORAL_TABLET | ORAL | Status: AC
  Administered 2019-07-28: 1000 mg via ORAL
  Filled 2019-07-28: qty 2

## 2019-07-28 MED ORDER — DEXAMETHASONE SODIUM PHOSPHATE 10 MG/ML IJ SOLN
INTRAMUSCULAR | Status: DC | PRN
  Administered 2019-07-28 (×2): 5 mg via INTRAVENOUS

## 2019-07-28 MED ORDER — GABAPENTIN 300 MG PO CAPS
300.0000 mg | ORAL_CAPSULE | ORAL | Status: AC
  Administered 2019-07-28: 300 mg via ORAL
  Filled 2019-07-28: qty 1

## 2019-07-28 MED ORDER — LIDOCAINE 2% (20 MG/ML) 5 ML SYRINGE
INTRAMUSCULAR | Status: AC
  Filled 2019-07-28: qty 5

## 2019-07-28 MED ORDER — MIDAZOLAM HCL 2 MG/2ML IJ SOLN
INTRAMUSCULAR | Status: DC | PRN
  Administered 2019-07-28: 2 mg via INTRAVENOUS

## 2019-07-28 MED ORDER — ENSURE PRE-SURGERY PO LIQD
296.0000 mL | Freq: Once | ORAL | Status: DC
  Filled 2019-07-28: qty 296

## 2019-07-28 MED ORDER — SUGAMMADEX SODIUM 200 MG/2ML IV SOLN
INTRAVENOUS | Status: DC | PRN
  Administered 2019-07-28: 155 mg via INTRAVENOUS

## 2019-07-28 MED ORDER — FENTANYL CITRATE (PF) 100 MCG/2ML IJ SOLN
INTRAMUSCULAR | Status: DC | PRN
  Administered 2019-07-28 (×4): 50 ug via INTRAVENOUS

## 2019-07-28 MED ORDER — LACTATED RINGERS IV SOLN
INTRAVENOUS | Status: DC
  Filled 2019-07-28: qty 1000

## 2019-07-28 MED ORDER — OXYCODONE-ACETAMINOPHEN 5-325 MG PO TABS
1.0000 | ORAL_TABLET | ORAL | Status: DC | PRN
  Filled 2019-07-28: qty 2

## 2019-07-28 MED ORDER — PROPOFOL 10 MG/ML IV BOLUS
INTRAVENOUS | Status: DC | PRN
  Administered 2019-07-28: 180 mg via INTRAVENOUS

## 2019-07-28 MED ORDER — SCOPOLAMINE 1 MG/3DAYS TD PT72
MEDICATED_PATCH | TRANSDERMAL | Status: AC
  Filled 2019-07-28: qty 1

## 2019-07-28 MED ORDER — BUPIVACAINE HCL (PF) 0.25 % IJ SOLN
INTRAMUSCULAR | Status: DC | PRN
  Administered 2019-07-28: 20 mL

## 2019-07-28 MED ORDER — OXYCODONE-ACETAMINOPHEN 5-325 MG PO TABS: 1.0000 | ORAL_TABLET | Freq: Four times a day (QID) | ORAL | 0 refills | Status: DC | PRN

## 2019-07-28 MED ORDER — KETOROLAC TROMETHAMINE 30 MG/ML IJ SOLN
INTRAMUSCULAR | Status: DC | PRN
  Administered 2019-07-28: 30 mg via INTRAVENOUS

## 2019-07-28 MED ORDER — KETOROLAC TROMETHAMINE 15 MG/ML IJ SOLN
15.0000 mg | INTRAMUSCULAR | Status: DC
  Filled 2019-07-28: qty 1

## 2019-07-28 MED ORDER — ROCURONIUM BROMIDE 10 MG/ML (PF) SYRINGE
PREFILLED_SYRINGE | INTRAVENOUS | Status: DC | PRN
  Administered 2019-07-28: 60 mg via INTRAVENOUS

## 2019-07-28 MED ORDER — ONDANSETRON HCL 4 MG/2ML IJ SOLN
INTRAMUSCULAR | Status: DC | PRN
  Administered 2019-07-28: 4 mg via INTRAVENOUS

## 2019-07-28 MED ORDER — IBUPROFEN 800 MG PO TABS: 800.0000 mg | ORAL_TABLET | Freq: Three times a day (TID) | ORAL | 0 refills | Status: AC | PRN

## 2019-07-28 MED ORDER — ROCURONIUM BROMIDE 10 MG/ML (PF) SYRINGE
PREFILLED_SYRINGE | INTRAVENOUS | Status: AC
  Filled 2019-07-28: qty 10

## 2019-07-28 MED ORDER — SCOPOLAMINE 1 MG/3DAYS TD PT72
1.0000 | MEDICATED_PATCH | TRANSDERMAL | Status: DC
  Administered 2019-07-28: 08:00:00 1.5 mg via TRANSDERMAL
  Filled 2019-07-28: qty 1

## 2019-07-28 SURGICAL SUPPLY — 40 items
APPLICATOR ARISTA FLEXITIP XL (MISCELLANEOUS) ×2 IMPLANT
CABLE HIGH FREQUENCY MONO STRZ (ELECTRODE) ×2 IMPLANT
COVER WAND RF STERILE (DRAPES) ×2 IMPLANT
DERMABOND ADVANCED (GAUZE/BANDAGES/DRESSINGS)
DERMABOND ADVANCED .7 DNX12 (GAUZE/BANDAGES/DRESSINGS) IMPLANT
DRSG COVADERM PLUS 2X2 (GAUZE/BANDAGES/DRESSINGS) ×6 IMPLANT
ELECT REM PT RETURN 9FT ADLT (ELECTROSURGICAL) ×2
ELECTRODE REM PT RTRN 9FT ADLT (ELECTROSURGICAL) ×1 IMPLANT
GLOVE BIO SURGEON STRL SZ 6 (GLOVE) ×4 IMPLANT
GLOVE BIO SURGEON STRL SZ 6.5 (GLOVE) ×4 IMPLANT
GLOVE BIOGEL PI IND STRL 6 (GLOVE) ×2 IMPLANT
GLOVE BIOGEL PI IND STRL 6.5 (GLOVE) ×1 IMPLANT
GLOVE BIOGEL PI IND STRL 7.0 (GLOVE) ×5 IMPLANT
GLOVE BIOGEL PI IND STRL 7.5 (GLOVE) ×2 IMPLANT
GLOVE BIOGEL PI INDICATOR 6 (GLOVE) ×2
GLOVE BIOGEL PI INDICATOR 6.5 (GLOVE) ×1
GLOVE BIOGEL PI INDICATOR 7.0 (GLOVE) ×5
GLOVE BIOGEL PI INDICATOR 7.5 (GLOVE) ×2
GLOVE ECLIPSE 6.0 STRL STRAW (GLOVE) ×4 IMPLANT
GOWN STRL REUS W/ TWL LRG LVL3 (GOWN DISPOSABLE) ×4 IMPLANT
GOWN STRL REUS W/TWL LRG LVL3 (GOWN DISPOSABLE) ×4
HEMOSTAT ARISTA ABSORB 3G PWDR (HEMOSTASIS) ×2 IMPLANT
KIT TURNOVER CYSTO (KITS) ×2 IMPLANT
LIGASURE VESSEL 5MM BLUNT TIP (ELECTROSURGICAL) IMPLANT
NS IRRIG 1000ML POUR BTL (IV SOLUTION) ×2 IMPLANT
PACK LAPAROSCOPY BASIN (CUSTOM PROCEDURE TRAY) ×2 IMPLANT
PACK TRENDGUARD 450 HYBRID PRO (MISCELLANEOUS) IMPLANT
POUCH SPECIMEN RETRIEVAL 10MM (ENDOMECHANICALS) IMPLANT
PROTECTOR NERVE ULNAR (MISCELLANEOUS) ×4 IMPLANT
SET IRRIG TUBING LAPAROSCOPIC (IRRIGATION / IRRIGATOR) ×2 IMPLANT
SET TUBE SMOKE EVAC HIGH FLOW (TUBING) ×2 IMPLANT
SUT MNCRL AB 3-0 PS2 27 (SUTURE) ×2 IMPLANT
SUT VICRYL 0 UR6 27IN ABS (SUTURE) IMPLANT
TOWEL OR 17X26 10 PK STRL BLUE (TOWEL DISPOSABLE) ×2 IMPLANT
TRAY FOLEY W/BAG SLVR 14FR (SET/KITS/TRAYS/PACK) ×2 IMPLANT
TRENDGUARD 450 HYBRID PRO PACK (MISCELLANEOUS)
TROCAR 12M 150ML BLUNT (TROCAR) ×2 IMPLANT
TROCAR BLADELESS OPT 5 100 (ENDOMECHANICALS) ×4 IMPLANT
TROCAR XCEL NON-BLD 11X100MML (ENDOMECHANICALS) ×2 IMPLANT
WARMER LAPAROSCOPE (MISCELLANEOUS) ×2 IMPLANT

## 2019-07-28 NOTE — Transfer of Care (Signed)
  Last Vitals:  Vitals Value Taken Time  BP 122/71 07/28/19 1045  Temp    Pulse 84 07/28/19 1055  Resp 16 07/28/19 1055  SpO2 99 % 07/28/19 1055  Vitals shown include unvalidated device data.  Last Pain:  Vitals:   07/28/19 1045  TempSrc:   PainSc: 0-No pain      Patients Stated Pain Goal: 6 (07/28/19 0803)

## 2019-07-28 NOTE — H&P (Signed)
Aften Lipsey is an 29 y.o. female G17 with history of anxiety/fibromyalgia/depression/chronic fatigue syndrome, here for diagnostic laparoscopy, possible excision of endometriosis due to chronic pelvic pain, endometriosis.   Originally seen by me 02/27/2019: Reported 1 year ago started having issues with pain, thought she had appendicitis went to ED, she didn't have insurance/didn't follow up, pain spontaneous improved. 11m started again with pain, [facility], treated PID/yeast, neg GC/CT, with some improvement in pain. Mostly now on her right side, improves with pushing on it, hot bathes help, physical activity makes it worse, no impact with BMs/voiding, light food ok, a lot of food makes it worse, tomatoes make it worse. Regular BMs pain x72m with intercourse, new, feels like she needs additional foreplay to handle insertion/pain with deep penetration. She has lower back pain with periods. Her Pain is worse with periods. She had tried birth control pills, estrogen gave migraines with aura, progesterone only pills (d/c 6 weeks ago because she felt it wasnt helping), initially it helped with acne, regulating cycles. Occasionally takes Motrin 600mg  but makes her tired, but it helps with pain.  Since that visit she has started pelvic floor PT and mirena IUD inserted 05/23/2019. Pelvic floor PT has helped some with her Vaginismus. Initially mirena IUD made pain worse and [place] 06/30/2019 confirmed correct placement and today patient reports mirena IUD pain is better but unsure if it has improved her chronic pelvic pain, desires to keep IUD for now.   Seen last week in the ED for fatigue, she has a history of chronic fatigue. Workup negative including pelvic US and CT scan.     Past Medical History:  Diagnosis Date  . Chronic fatigue syndrome    followed by dr Olevia Bowens   . Chronic pain   . Endometriosis   . Fibromyalgia    followed by- dr Olevia Bowens  . GAD (generalized anxiety disorder)   . History of anemia    . MDD (major depressive disorder)   . Migraines   . Multinodular thyroid    last thyroid ultrasound done by dr Olevia Bowens in epic 07-21-2018, stable and did not meet critiria for bx  . Pelvic pain   . PTSD (post-traumatic stress disorder)     Past Surgical History:  Procedure Laterality Date  . NO PAST SURGERIES    . WISDOM TOOTH EXTRACTION      History reviewed. No pertinent family history.  Social History:  reports that she has never smoked. She has never used smokeless tobacco. She reports current alcohol use. She reports that she does not use drugs.  Allergies: No Known Allergies  Medications Prior to Admission  Medication Sig Dispense Refill Last Dose  . busPIRone (BUSPAR) 15 MG tablet Take 15 mg by mouth 3 (three) times daily.     . cyclobenzaprine (FEXMID) 7.5 MG tablet Take 7.5 mg by mouth 3 (three) times daily as needed for muscle spasms.     Marland Kitchen escitalopram (LEXAPRO) 10 MG tablet Take 10 mg by mouth at bedtime.      Marland Kitchen levonorgestrel (MIRENA) 20 MCG/24HR IUD 1 each by Intrauterine route once.     . Multiple Vitamins-Minerals (CENTRUM ADULTS) TABS Take 1 tablet by mouth daily.     . pregabalin (LYRICA) 50 MG capsule Take 100 mg by mouth at bedtime.      . metroNIDAZOLE (FLAGYL) 500 MG tablet Take 1 tablet (500 mg total) by mouth 2 (two) times daily. 21 tablet 0     Review of Systems -  per HPI Vitals:   07/28/19 0803  BP: 118/83  Pulse: 70  Resp: 14  Temp: 97.9 F (36.6 C)  SpO2: 100%    Height 5\' 10"  (1.778 m), weight 77.1 kg. Physical Exam  Resting comfortably in chair  Results for orders placed or performed during the hospital encounter of 07/28/19 (from the past 24 hour(s))  Pregnancy, urine POC     Status: None   Collection Time: 07/28/19  7:34 AM  Result Value Ref Range   Preg Test, Ur NEGATIVE NEGATIVE   Assessment/Plan: 28yo with chronic pelvic pain, endometriosis, here for exam under anesthesia, diagnostic laparoscopy, possible excision of  endometriosis.   1. Diagnostic laparoscopy: The nature of the procedure was discussed with the patient in detail. An informed discussion was held regarding the risks and benefits of surgical intervention. Specifically, the patient was apprised of risks of pain, bleeding requiring blood transfusion, infection requiring antibiotics, injury to nearby organs (bowel, bladder, nerves, blood vessels, ureter), or failure to achieve desired results. She was informed of the low but real risk of these complications, and understands that the alternative is no surgery. An opportunity to ask questions was provided, and all questions were answered to the patient's satisfaction. Patient expresses understanding of these issues, and agrees to proceed with the plan outlined above. The expected post-operative recovery course was discussed with the patient, and post-operative instructions were reviewed.  Lauralei Clouse K Taam-Akelman 07/28/2019, 7:59 AM

## 2019-07-28 NOTE — Brief Op Note (Signed)
07/28/2019  9:54 AM  PATIENT:  Alicia Olsen  28 y.o. female  PRE-OPERATIVE DIAGNOSIS:  pelvic pain  POST-OPERATIVE DIAGNOSIS:  pelvic pain  PROCEDURE:  Procedure(s): LAPAROSCOPY OPERATIVE, EXCISION OF ENDOMETRIOSIS (N/A)  SURGEON:  Surgeon(s) and Role:    * Taam-Akelman, Griselda Miner, MD - Primary    * Carrington Clamp, MD - Assisting  ANESTHESIA:   local and general  EBL:  10 mL   BLOOD ADMINISTERED:none  DRAINS: none   LOCAL MEDICATIONS USED: 20cc 0.25%  MARCAINE     SPECIMEN:  Source of Specimen:  right uterosacral peritoneum excision  DISPOSITION OF SPECIMEN:  PATHOLOGY - to evaluate for endometriosis  COUNTS:  YES  TOURNIQUET:  * No tourniquets in log *  DICTATION: .Note written in EPIC  PLAN OF CARE: Discharge to home after PACU  PATIENT DISPOSITION:  PACU - hemodynamically stable.   Delay start of Pharmacological VTE agent (>24hrs) due to surgical blood loss or risk of bleeding: not applicable Bracy Pepper K Taam-Akelman 07/28/19   9:55 AM

## 2019-07-28 NOTE — Discharge Instructions (Signed)
Diagnostic Laparoscopy, Care After This sheet gives you information about how to care for yourself after your procedure. Your health care provider may also give you more specific instructions. If you have problems or questions, contact your health care provider. What can I expect after the procedure? After the procedure, it is common to have:  Mild discomfort in the abdomen.  Sore throat. Women who have laparoscopy with pelvic examination may have mild cramping and fluid coming from the vagina for a few days after the procedure. Follow these instructions at home: Medicines  Take over-the-counter and prescription medicines only as told by your health care provider.  If you were prescribed an antibiotic medicine, take it as told by your health care provider. Do not stop taking the antibiotic even if you start to feel better. Driving  Do not drive for 24 hours if you were given a medicine to help you relax (sedative) during your procedure.  Do not drive or use heavy machinery while taking prescription pain medicine. Bathing  Do not take baths, swim, or use a hot tub until your health care provider approves. You may take showers. Incision care   Follow instructions from your health care provider about how to take care of your incisions. Make sure you: ? Wash your hands with soap and water before you change your bandage (dressing). If soap and water are not available, use hand sanitizer. ? Change your dressing as told by your health care provider. ? Leave stitches (sutures), skin glue, or adhesive strips in place. These skin closures may need to stay in place for 2 weeks or longer. If adhesive strip edges start to loosen and curl up, you may trim the loose edges. Do not remove adhesive strips completely unless your health care provider tells you to do that.  Check your incision areas every day for signs of infection. Check for: ? Redness, swelling, or pain. ? Fluid or  blood. ? Warmth. ? Pus or a bad smell. Activity  Return to your normal activities as told by your health care provider. Ask your health care provider what activities are safe for you.  Do not lift anything that is heavier than 10 lb (4.5 kg), or the limit that you are told, until your health care provider says that it is safe. General instructions  To prevent or treat constipation while you are taking prescription pain medicine, your health care provider may recommend that you: ? Drink enough fluid to keep your urine pale yellow. ? Take over-the-counter or prescription medicines. ? Eat foods that are high in fiber, such as fresh fruits and vegetables, whole grains, and beans. ? Limit foods that are high in fat and processed sugars, such as fried and sweet foods.  Do not use any products that contain nicotine or tobacco, such as cigarettes and e-cigarettes. If you need help quitting, ask your health care provider.  Keep all follow-up visits as told by your health care provider. This is important. Contact a health care provider if:  You develop shoulder pain.  You feel lightheaded or faint.  You are unable to pass gas or have a bowel movement.  You feel nauseous or you vomit.  You develop a rash.  You have redness, swelling, or pain around any incision.  You have fluid or blood coming from any incision.  Any incision feels warm to the touch.  You have pus or a bad smell coming from any incision.  You have a fever or chills. Get help   right away if:  You have severe pain.  You have vomiting that does not go away.  You have heavy bleeding from the vagina.  Any incision opens.  You have trouble breathing.  You have chest pain. Summary  After the procedure, it is common to have mild discomfort in the abdomen and a sore throat.  Check your incision areas every day for signs of infection.  Return to your normal activities as told by your health care provider. Ask  your health care provider what activities are safe for you. This information is not intended to replace advice given to you by your health care provider. Make sure you discuss any questions you have with your health care provider. Document Revised: 05/07/2017 Document Reviewed: 11/18/2016 Elsevier Patient Education  2020 [employer].  Prescriptions Motrin 800mg  every 8 hours for pain Acetaminophen 650mg  every 6 hours for moderate pain Percocet (Oxycodone 5mg -acetaminophen 325mg ) every 4 hours for severe pain.  Make sure to not exceed acetaminophen 3000mg  every day.   Post Anesthesia Home Care Instructions  Activity: Get plenty of rest for the remainder of the day. A responsible individual must stay with you for 24 hours following the procedure.  For the next 24 hours, DO NOT: -Drive a car - -Drink alcoholic beverages -Take any medication unless instructed by your physician -Make any legal decisions or sign important papers.  Meals: Start with liquid foods such as gelatin or soup. Progress to regular foods as tolerated. Avoid greasy, spicy, heavy foods. If nausea and/or vomiting occur, drink only clear liquids until the nausea and/or vomiting subsides. Call your physician if vomiting continues.  Special Instructions/Symptoms: Your throat may feel dry or sore from the anesthesia or the breathing tube placed in your throat during surgery. If this causes discomfort, gargle with warm salt water. The discomfort should disappear within 24 hours.  If you had a scopolamine patch placed behind your ear for the management of post- operative nausea and/or vomiting:  1. The medication in the patch is effective for 72 hours, after which it should be removed.  Wrap patch in a tissue and discard in the trash. Wash hands thoroughly with soap and water. 2. You may remove the patch earlier than 72 hours if you experience unpleasant side effects which may include dry mouth, dizziness  or visual disturbances. 3. Avoid touching the patch. Wash your hands with soap and water after contact with the patch.

## 2019-07-28 NOTE — Anesthesia Preprocedure Evaluation (Signed)
Anesthesia Evaluation  Patient identified by MRN, date of birth, ID band Patient awake    Reviewed: Allergy & Precautions, NPO status , Patient's Chart, lab work & pertinent test results  Airway Mallampati: I  TM Distance: >3 FB Neck ROM: Full    Dental  (+) Dental Advisory Given, Teeth Intact   Pulmonary neg pulmonary ROS, neg recent URI,    breath sounds clear to auscultation       Cardiovascular negative cardio ROS   Rhythm:Regular     Neuro/Psych  Headaches, PSYCHIATRIC DISORDERS Anxiety Depression  Neuromuscular disease    GI/Hepatic negative GI ROS, Neg liver ROS,   Endo/Other  negative endocrine ROS  Renal/GU negative Renal ROS     Musculoskeletal  (+) Fibromyalgia -  Abdominal   Peds  Hematology negative hematology ROS (+)   Anesthesia Other Findings   Reproductive/Obstetrics                             Anesthesia Physical Anesthesia Plan  ASA: II  Anesthesia Plan: General   Post-op Pain Management:    Induction: Intravenous  PONV Risk Score and Plan: 3 and Ondansetron and Dexamethasone  Airway Management Planned: Oral ETT  Additional Equipment: None  Intra-op Plan:   Post-operative Plan: Extubation in OR  Informed Consent: I have reviewed the patients History and Physical, chart, labs and discussed the procedure including the risks, benefits and alternatives for the proposed anesthesia with the patient or authorized representative who has indicated his/her understanding and acceptance.     Dental advisory given  Plan Discussed with: CRNA and Surgeon  Anesthesia Plan Comments:         Anesthesia Quick Evaluation

## 2019-07-28 NOTE — Anesthesia Procedure Notes (Signed)
Procedure Name: Intubation Date/Time: 07/28/2019 8:48 AM Performed by: Suan Halter, CRNA Pre-anesthesia Checklist: Patient identified, Emergency Drugs available, Suction available and Patient being monitored Patient Re-evaluated:Patient Re-evaluated prior to induction Oxygen Delivery Method: Circle system utilized Preoxygenation: Pre-oxygenation with 100% oxygen Induction Type: IV induction Ventilation: Mask ventilation without difficulty Laryngoscope Size: Mac and 3 Grade View: Grade I Tube type: Oral Tube size: 7.0 mm Number of attempts: 1 Airway Equipment and Method: Stylet and Oral airway Placement Confirmation: ETT inserted through vocal cords under direct vision,  positive ETCO2 and breath sounds checked- equal and bilateral Tube secured with: Tape Dental Injury: Teeth and Oropharynx as per pre-operative assessment

## 2019-07-28 NOTE — Anesthesia Postprocedure Evaluation (Signed)
Anesthesia Post Note  Patient: Janace Hoard  Procedure(s) Performed: LAPAROSCOPY OPERATIVE, EXCISION OF ENDOMETRIOSIS (N/A Abdomen)     Patient location during evaluation: PACU Anesthesia Type: General Level of consciousness: awake and alert Pain management: pain level controlled Vital Signs Assessment: post-procedure vital signs reviewed and stable Respiratory status: spontaneous breathing, nonlabored ventilation, respiratory function stable and patient connected to nasal cannula oxygen Cardiovascular status: blood pressure returned to baseline and stable Postop Assessment: no apparent nausea or vomiting Anesthetic complications: no    Last Vitals:  Vitals:   07/28/19 1030 07/28/19 1045  BP: 122/83 122/71  Pulse: 84 68  Resp: 17 13  Temp:    SpO2: 100% 100%    Last Pain:  Vitals:   07/28/19 1045  TempSrc:   PainSc: 0-No pain                 Safiya Girdler

## 2019-07-28 NOTE — Op Note (Signed)
Operative Note PATIENT:  Alicia Olsen  29 y.o. female  PRE-OPERATIVE DIAGNOSIS:  pelvic pain  POST-OPERATIVE DIAGNOSIS:  pelvic pain, Endometriosis stage I  PROCEDURE:  Procedure(s): LAPAROSCOPY OPERATIVE, EXCISION OF ENDOMETRIOSIS (N/A)  SURGEON:  Surgeon(s) and Role:    * Taam-Akelman, Griselda Miner, MD - Primary    * Carrington Clamp, MD - Assisting  ANESTHESIA:   local and general  EBL:  10 mL   BLOOD ADMINISTERED:none  DRAINS: none   LOCAL MEDICATIONS USED: 20cc 0.25%  MARCAINE     SPECIMEN:  Source of Specimen:  right uterosacral peritoneum excision  DISPOSITION OF SPECIMEN:  PATHOLOGY - to evaluate for endometriosis  COUNTS:  YES  TOURNIQUET:  * No tourniquets in log *  DICTATION: .Note written in EPIC  PLAN OF CARE: Discharge to home after PACU  PATIENT DISPOSITION:  PACU - hemodynamically stable.   Delay start of Pharmacological VTE agent (>24hrs) due to surgical blood loss or risk of bleeding: not applicable  Findings: Exam under anesthesia revealed small mobile anteverted uterus. Diagnostic laparoscopy revealed normal ovaries, fallopian tubes and uterus. Normal appearing appendix with filmy adhesion. The ovaries, bladder, pelvic sidewall, and colon were inspected and noted to not have any endometrial implants. Right and left uterosacral with endometriosis implants, consistent with stage I endometriosis.  Description of procedure  After consent was verified, the patient was taken to the operating room where general anesthesia was administered without difficulty. The patient was placed in the dorsal lithotomy position using Allen stirrups.  Exam under anesthesia was performed, revealing a small mobile anteverted uterus. The abdomen and vagina were subsequently prepped and draped in the normal sterile fashion. A foley catheter was placed. A speculum was placed in the patient's vagina and the anterior lip of the cervix was grasped with a single-tooth  tenaculum and a hulka manipulator was placed through the os and secured to the anterior lip. The tenaculum and speculum were removed.   Attention was then turned to the patient's abdomen where a 61mm vertical skin incision was made at the base of the umbilicus The abdominal wall was tented and the 67mm Optiview trochar and sleeve were advanced, this was switched to a 75mm Optiview trochar which was advanced without difficulty into the abdomen where intra-abdominal placement was confirmed by laparoscope. A second 39mm skin incision was made approximately 4cm above the right anterior superior iliac spine.  The second trochar and sleeve were then advanced under direct visualization. A third 14mm skin incision was made and trochar was placed in similar fashion approximately 4cm above the left anterior superior iliac spine. A blunt grasper was used to sweep the bowel superiorly and provide adequate visualization of the female anatomy. A survey of the patient's pelvis and abdomen revealed: normal appendix with a filmy adhesion, normal ovaries, fallopian tubes and uterus. Right and left uterosacral with endometriosis implants, consistent with stage I endometriosis.   On the left uterosacral peritoneum there were 4 scattered blue implants that were cauterized with the monopolar scissors. On the right uterosacral peritoneum there were blue implants and white scarring of the peritoneum, this was excised with the monopolar scissors. Rendered hemostatic with cautery and arista. The intraabdominal pressure was lowered, and hemostasis was confirmed.   All instruments were removed from the patient's abdomen. The umbilucis fascia was closed with vicryl on a UR 6 in a running fashion. The skin incisions were repaired with monocryl. The uterine manipulator was removed from the vagina with a small amount of bleeding from  the cervix, which was hemostatic with pressure. The patient tolerated the procedure well and was taken to the  recovery room in stable condition. Sponge, lap, and needle counts were correct x3.  Dosia Yodice K Taam-Akelman 07/28/19 12:31 PM

## 2019-07-31 ENCOUNTER — Other Ambulatory Visit (HOSPITAL_COMMUNITY): Payer: Self-pay | Admitting: Obstetrics & Gynecology

## 2019-07-31 LAB — SURGICAL PATHOLOGY

## 2019-07-31 MED ORDER — OXYCODONE-ACETAMINOPHEN 5-325 MG PO TABS: 1.0000 | ORAL_TABLET | Freq: Four times a day (QID) | ORAL | 0 refills | Status: AC | PRN

## 2019-07-31 NOTE — Progress Notes (Signed)
GYN Phone call Patient's husband called, reporting incisional pain not controlled with motrin, previously controlled with percocet but she finished the 10 tablets of percocet given postop. No emesis, fever, chills. Passing flatus. Rx prescribed for percocet x15 tablets. Recommend continued motrin scheduled. Discussed precautions.  Shamond Skelton K Taam-Akelman 07/31/19 8:48 PM

## 2019-10-18 ENCOUNTER — Other Ambulatory Visit: Payer: Self-pay | Admitting: Rheumatology

## 2019-10-18 DIAGNOSIS — M47819 Spondylosis without myelopathy or radiculopathy, site unspecified: Secondary | ICD-10-CM

## 2019-11-01 ENCOUNTER — Other Ambulatory Visit: Payer: BC Managed Care – PPO

## 2019-11-25 ENCOUNTER — Ambulatory Visit
Admission: RE | Admit: 2019-11-25 | Discharge: 2019-11-25 | Disposition: A | Discharge status: Home / Self Care | Payer: BC Managed Care – PPO | Source: Ambulatory Visit | Attending: Rheumatology | Admitting: Rheumatology

## 2019-11-25 ENCOUNTER — Other Ambulatory Visit: Payer: Self-pay

## 2019-11-25 DIAGNOSIS — M47819 Spondylosis without myelopathy or radiculopathy, site unspecified: Secondary | ICD-10-CM

## 2021-10-04 IMAGING — MR MR SACRUM / SI JOINTS WO CM
5 series · 37 of 48 positions shown · non-contrast
Comparison: CT scan of the abdomen and pelvis dated 07/22/2019

CLINICAL DATA: Spondyloarthropathy. Posterior pelvic and SI joint
pain for several months.

EXAM:
MRI SACRUM WITHOUT CONTRAST
TECHNIQUE: Multiplanar, multisequence MR imaging of the sacrum was performed.
No intravenous contrast was administered.

[Series 3: T2 fat-sat · sagittal · 4.0mm · 0.75mm/px · 8 of 37 slices shown (1 of 2)]
[im 1/37]
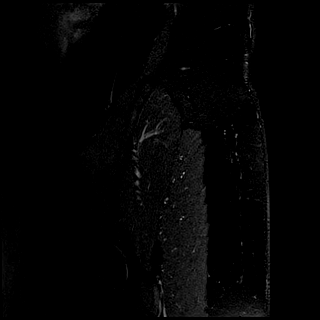
[im 5/37]
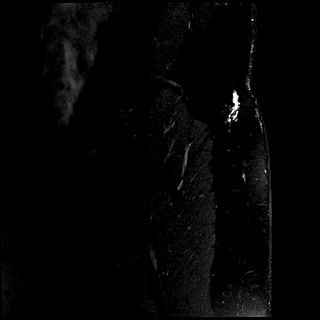
[im 13/37]
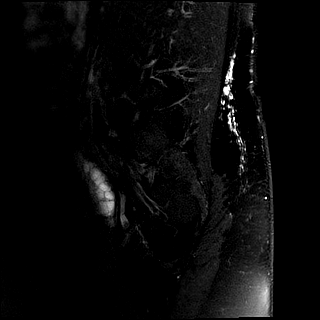
[im 17/37]
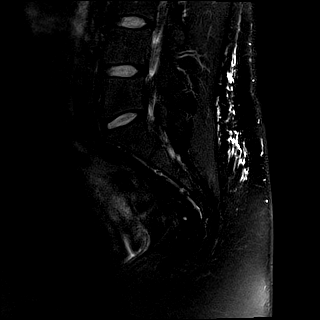
[im 21/37]
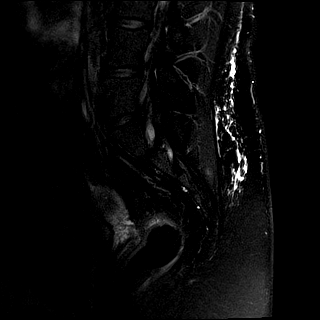
[im 25/37]
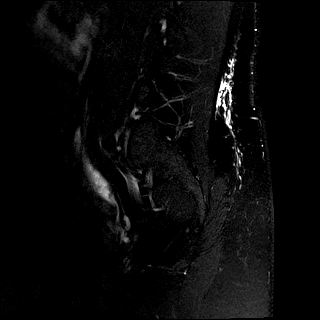
[im 33/37]
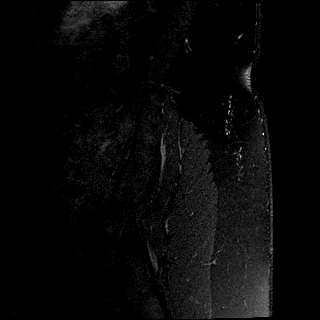
[im 37/37]
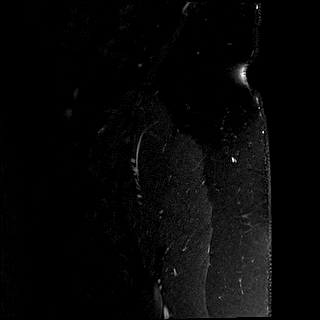

[Series 5: T1 · coronal · 3.0mm · 0.88mm/px · 7 of 26 slices shown (1 of 2)]
[im 1/26]
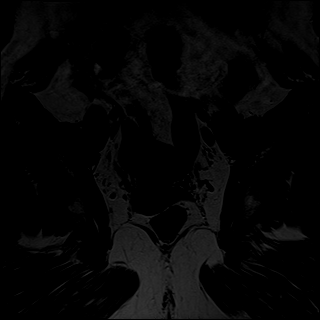
[im 5/26]
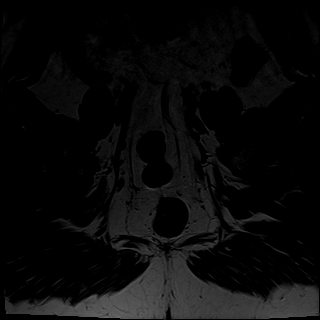
[im 9/26]
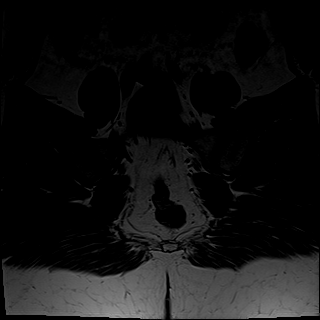
[im 13/26]
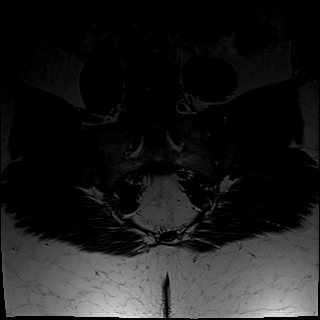
[im 17/26]
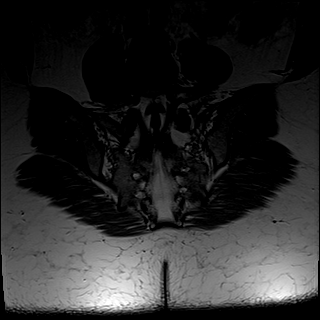
[im 21/26]
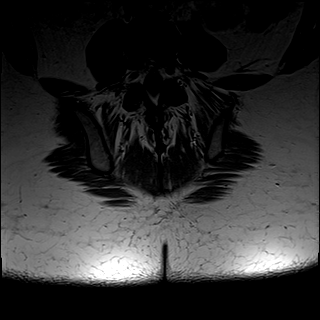
[im 26/26]
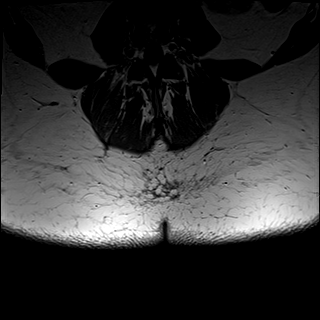

[Series 6: STIR · coronal · 3.0mm · 0.55mm/px · 7 of 26 slices shown]
[im 1/26]
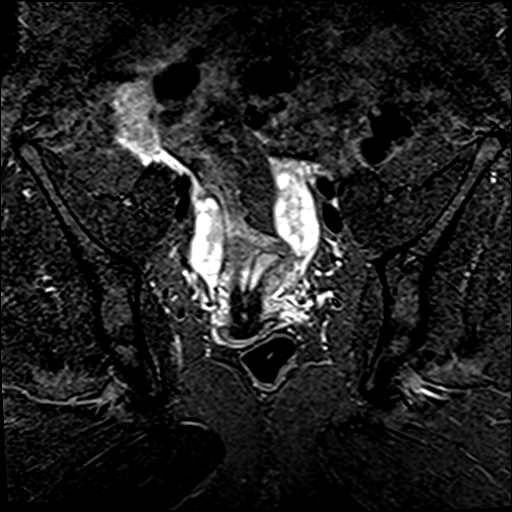
[im 5/26]
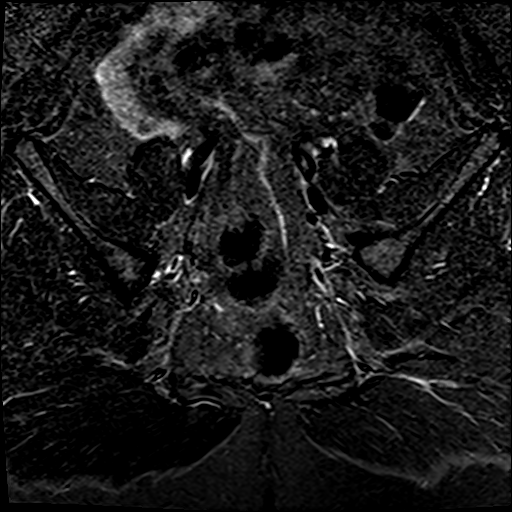
[im 9/26]
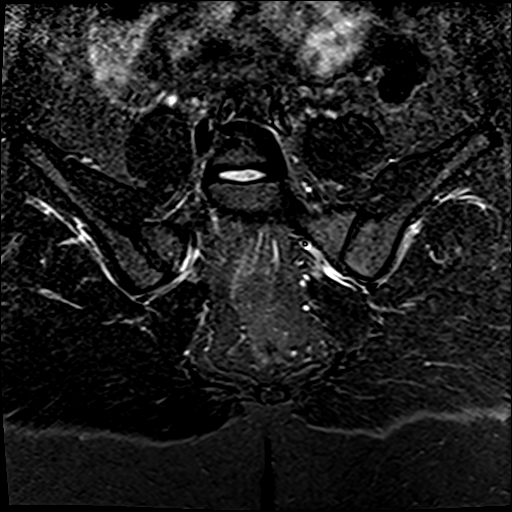
[im 13/26]
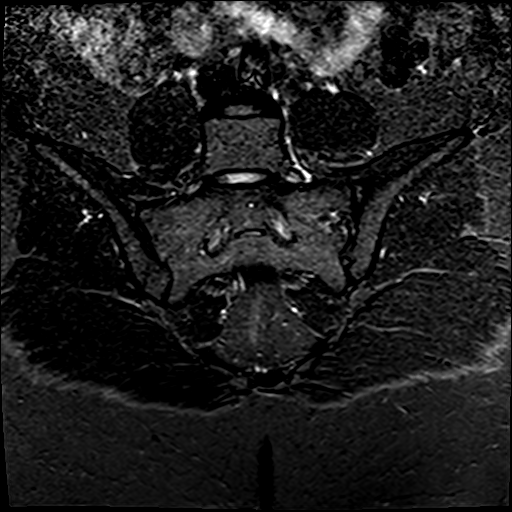
[im 17/26]
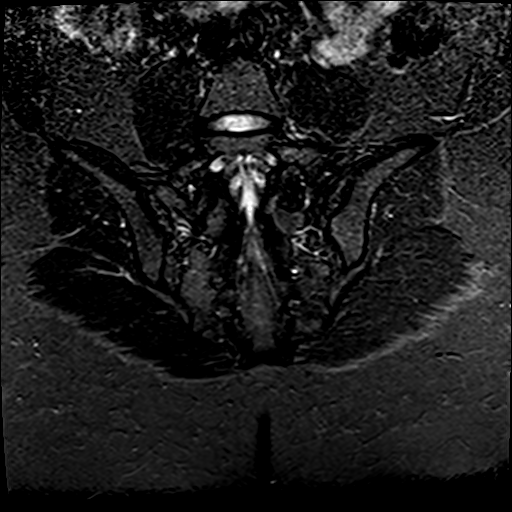
[im 21/26]
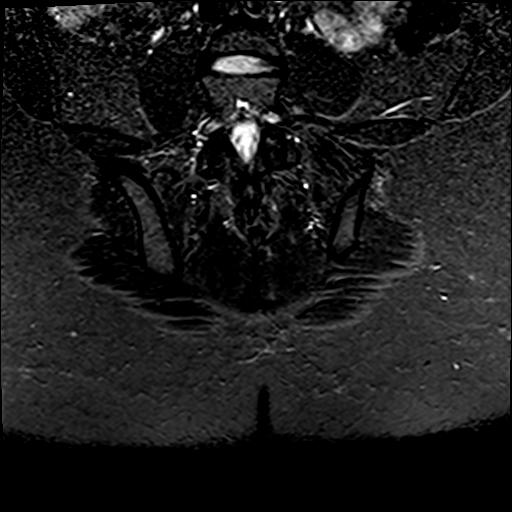
[im 26/26]
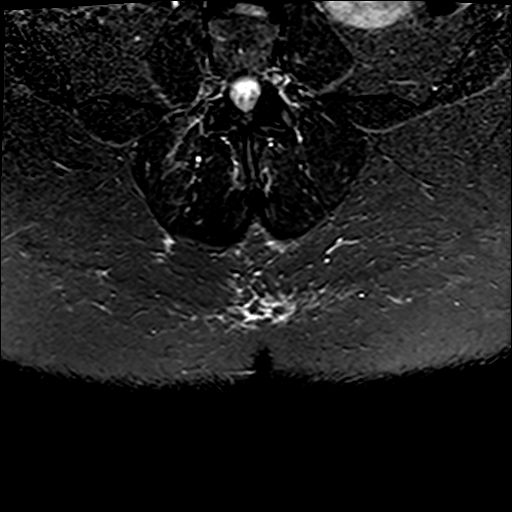

[Series 7: T2 fat-sat · axial · 4.0mm · 0.94mm/px · z∈[-121,+109]mm · 9 of 47 slices shown (2 of 2)]
[im 1/47]
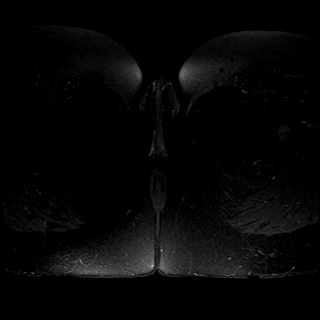
[im 9/47]
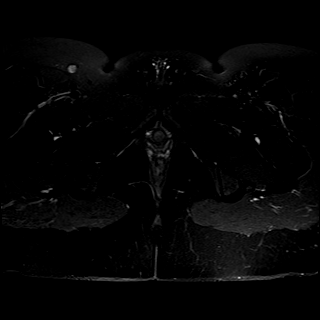
[im 13/47]
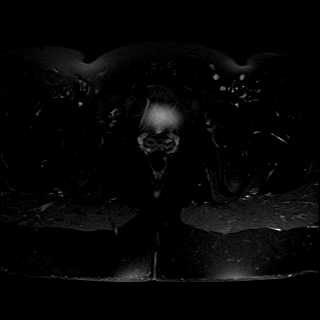
[im 21/47]
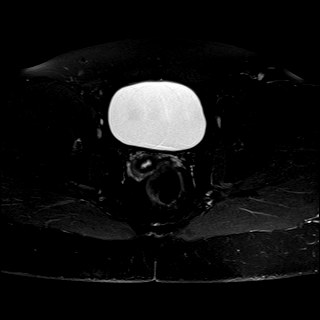
[im 26/47]
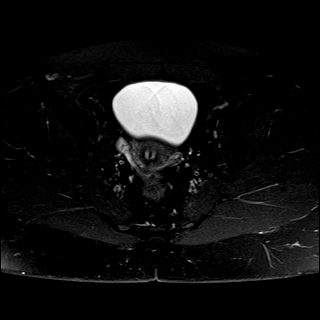
[im 34/47]
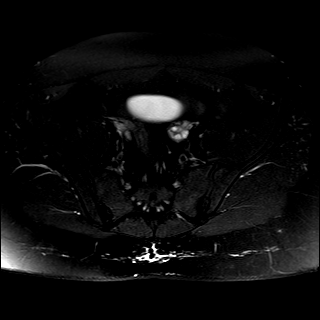
[im 38/47]
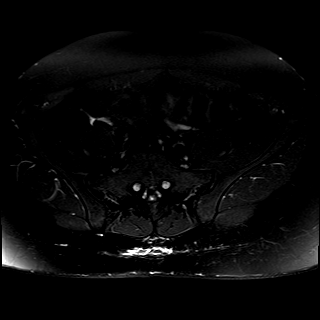
[im 42/47]
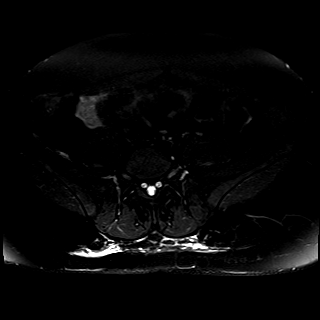
[im 47/47]
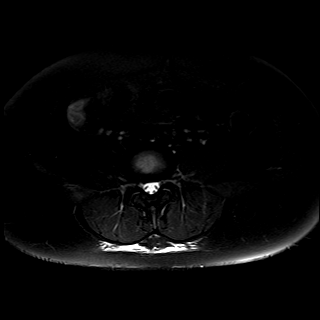

[Series 8: T1 · axial · 4.0mm · 0.94mm/px · z∈[-121,+44]mm · 6 of 47 slices shown (2 of 2)]
[im 1/47]
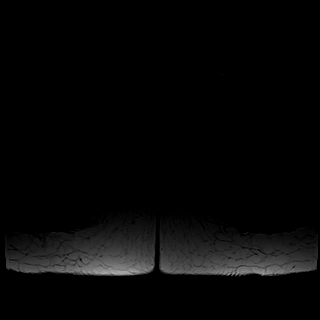
[im 9/47]
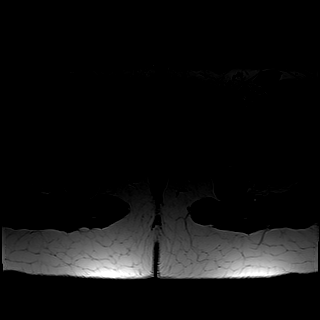
[im 13/47]
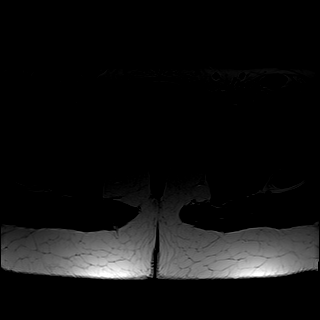
[im 21/47]
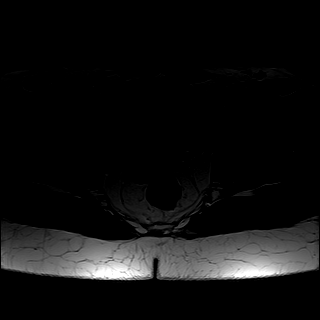
[im 26/47]
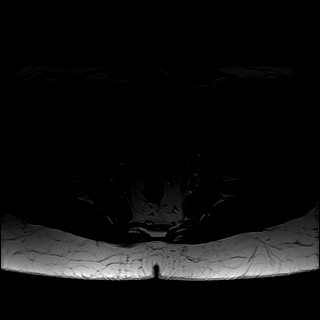
[im 34/47]
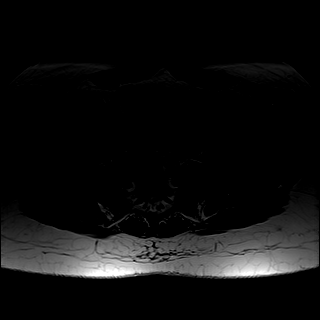

[37 of 48 positions shown; findings below may reference images not displayed]

FINDINGS: Bones: The sacrum and coccyx appear normal. Sacroiliac joints are
normal. L3-4 through L5-S1 appear normal.

Soft tissues: No mass lesions, free fluid, or significant other
abnormalities.
IMPRESSION: Normal MRI of the sacrum and coccyx. Specifically, no evidence of
sacroiliitis.
